# Patient Record
Sex: Female | Born: 1995 | Race: Black or African American | Hispanic: No | Marital: Single | Smoking: Never smoker
Health system: Southern US, Community
[De-identification: ages and names within clinical notes are randomized; demographics above are authoritative.]

## PROBLEM LIST (undated history)

## (undated) ENCOUNTER — Inpatient Hospital Stay (HOSPITAL_COMMUNITY): Payer: Self-pay

## (undated) DIAGNOSIS — Z9889 Other specified postprocedural states: Secondary | ICD-10-CM

## (undated) DIAGNOSIS — D649 Anemia, unspecified: Secondary | ICD-10-CM

## (undated) DIAGNOSIS — A599 Trichomoniasis, unspecified: Secondary | ICD-10-CM

## (undated) DIAGNOSIS — J45909 Unspecified asthma, uncomplicated: Secondary | ICD-10-CM

## (undated) DIAGNOSIS — A749 Chlamydial infection, unspecified: Secondary | ICD-10-CM

## (undated) DIAGNOSIS — F419 Anxiety disorder, unspecified: Secondary | ICD-10-CM

## (undated) DIAGNOSIS — K219 Gastro-esophageal reflux disease without esophagitis: Secondary | ICD-10-CM

## (undated) DIAGNOSIS — A549 Gonococcal infection, unspecified: Secondary | ICD-10-CM

## (undated) DIAGNOSIS — T4145XA Adverse effect of unspecified anesthetic, initial encounter: Secondary | ICD-10-CM

## (undated) DIAGNOSIS — N390 Urinary tract infection, site not specified: Secondary | ICD-10-CM

## (undated) DIAGNOSIS — T8859XA Other complications of anesthesia, initial encounter: Secondary | ICD-10-CM

## (undated) DIAGNOSIS — N809 Endometriosis, unspecified: Secondary | ICD-10-CM

## (undated) DIAGNOSIS — R112 Nausea with vomiting, unspecified: Secondary | ICD-10-CM

## (undated) HISTORY — PX: WISDOM TOOTH EXTRACTION: SHX21

## (undated) HISTORY — PX: TONSILLECTOMY: SUR1361

## (undated) HISTORY — PX: OTHER SURGICAL HISTORY: SHX169

## (undated) HISTORY — PX: TYMPANOSTOMY TUBE PLACEMENT: SHX32

---

## 2014-02-08 ENCOUNTER — Emergency Department (HOSPITAL_COMMUNITY)
Admission: EM | Admit: 2014-02-08 | Discharge: 2014-02-09 | Disposition: A | Discharge status: Home / Self Care | Payer: BC Managed Care – PPO | Attending: Emergency Medicine | Admitting: Emergency Medicine

## 2014-02-08 ENCOUNTER — Encounter (HOSPITAL_COMMUNITY): Payer: Self-pay | Admitting: Emergency Medicine

## 2014-02-08 DIAGNOSIS — Z792 Long term (current) use of antibiotics: Secondary | ICD-10-CM | POA: Diagnosis not present

## 2014-02-08 DIAGNOSIS — Z7951 Long term (current) use of inhaled steroids: Secondary | ICD-10-CM | POA: Insufficient documentation

## 2014-02-08 DIAGNOSIS — K0889 Other specified disorders of teeth and supporting structures: Secondary | ICD-10-CM

## 2014-02-08 DIAGNOSIS — R102 Pelvic and perineal pain: Secondary | ICD-10-CM | POA: Diagnosis not present

## 2014-02-08 DIAGNOSIS — R55 Syncope and collapse: Secondary | ICD-10-CM | POA: Diagnosis not present

## 2014-02-08 DIAGNOSIS — Z8619 Personal history of other infectious and parasitic diseases: Secondary | ICD-10-CM | POA: Insufficient documentation

## 2014-02-08 DIAGNOSIS — Z3202 Encounter for pregnancy test, result negative: Secondary | ICD-10-CM | POA: Diagnosis not present

## 2014-02-08 DIAGNOSIS — K120 Recurrent oral aphthae: Secondary | ICD-10-CM

## 2014-02-08 DIAGNOSIS — K08409 Partial loss of teeth, unspecified cause, unspecified class: Secondary | ICD-10-CM | POA: Insufficient documentation

## 2014-02-08 DIAGNOSIS — R112 Nausea with vomiting, unspecified: Secondary | ICD-10-CM | POA: Diagnosis present

## 2014-02-08 DIAGNOSIS — K088 Other specified disorders of teeth and supporting structures: Secondary | ICD-10-CM | POA: Insufficient documentation

## 2014-02-08 DIAGNOSIS — J45909 Unspecified asthma, uncomplicated: Secondary | ICD-10-CM | POA: Diagnosis not present

## 2014-02-08 HISTORY — DX: Gastro-esophageal reflux disease without esophagitis: K21.9

## 2014-02-08 HISTORY — DX: Unspecified asthma, uncomplicated: J45.909

## 2014-02-08 LAB — URINALYSIS, ROUTINE W REFLEX MICROSCOPIC
Glucose, UA: NEGATIVE mg/dL
HGB URINE DIPSTICK: NEGATIVE
Ketones, ur: >80 mg/dL — AB
NITRITE: NEGATIVE
PH: 6 (ref 5.0–8.0)
Protein, ur: NEGATIVE mg/dL
SPECIFIC GRAVITY, URINE: 1.028 (ref 1.005–1.030)
UROBILINOGEN UA: 1 mg/dL (ref 0.0–1.0)

## 2014-02-08 LAB — POC URINE PREG, ED: PREG TEST UR: NEGATIVE

## 2014-02-08 LAB — URINE MICROSCOPIC-ADD ON

## 2014-02-08 NOTE — ED Notes (Signed)
Pt presents with c/o n/v, strong odor in urine, infected wisdom teeth extraction site, ?thrush in mouth, pt states she had syncope x 2 today.

## 2014-02-09 LAB — CBC WITH DIFFERENTIAL/PLATELET
Basophils Absolute: 0.1 10*3/uL (ref 0.0–0.1)
Basophils Relative: 1 % (ref 0–1)
EOS ABS: 0.1 10*3/uL (ref 0.0–0.7)
Eosinophils Relative: 2 % (ref 0–5)
HCT: 36.7 % (ref 36.0–46.0)
Hemoglobin: 12.2 g/dL (ref 12.0–15.0)
LYMPHS ABS: 2.2 10*3/uL (ref 0.7–4.0)
Lymphocytes Relative: 38 % (ref 12–46)
MCH: 26.8 pg (ref 26.0–34.0)
MCHC: 33.2 g/dL (ref 30.0–36.0)
MCV: 80.5 fL (ref 78.0–100.0)
Monocytes Absolute: 0.5 10*3/uL (ref 0.1–1.0)
Monocytes Relative: 9 % (ref 3–12)
Neutro Abs: 2.9 10*3/uL (ref 1.7–7.7)
Neutrophils Relative %: 50 % (ref 43–77)
Platelets: 247 10*3/uL (ref 150–400)
RBC: 4.56 MIL/uL (ref 3.87–5.11)
RDW: 12.2 % (ref 11.5–15.5)
WBC: 5.8 10*3/uL (ref 4.0–10.5)

## 2014-02-09 LAB — COMPREHENSIVE METABOLIC PANEL
ALBUMIN: 3.9 g/dL (ref 3.5–5.2)
ALT: 12 U/L (ref 0–35)
ANION GAP: 17 — AB (ref 5–15)
AST: 15 U/L (ref 0–37)
Alkaline Phosphatase: 64 U/L (ref 39–117)
BILIRUBIN TOTAL: 0.9 mg/dL (ref 0.3–1.2)
BUN: 11 mg/dL (ref 6–23)
CALCIUM: 9.2 mg/dL (ref 8.4–10.5)
CO2: 23 mEq/L (ref 19–32)
CREATININE: 0.66 mg/dL (ref 0.50–1.10)
Chloride: 98 mEq/L (ref 96–112)
GFR calc Af Amer: >90 mL/min (ref >90)
GFR calc non Af Amer: >90 mL/min (ref >90)
Glucose, Bld: 71 mg/dL (ref 70–99)
Potassium: 3.8 mEq/L (ref 3.7–5.3)
Sodium: 138 mEq/L (ref 137–147)
Total Protein: 8 g/dL (ref 6.0–8.3)

## 2014-02-09 LAB — WET PREP, GENITAL
Clue Cells Wet Prep HPF POC: NONE SEEN
TRICH WET PREP: NONE SEEN
Yeast Wet Prep HPF POC: NONE SEEN

## 2014-02-09 MED ORDER — GI COCKTAIL ~~LOC~~
30.0000 mL | Freq: Once | ORAL | Status: AC
  Administered 2014-02-09: 30 mL via ORAL
  Filled 2014-02-09: qty 30

## 2014-02-09 MED ORDER — SODIUM CHLORIDE 0.9 % IV BOLUS (SEPSIS)
1000.0000 mL | INTRAVENOUS | Status: AC
  Administered 2014-02-09: 1000 mL via INTRAVENOUS

## 2014-02-09 MED ORDER — HYDROMORPHONE HCL 1 MG/ML IJ SOLN
1.0000 mg | Freq: Once | INTRAMUSCULAR | Status: AC
  Administered 2014-02-09: 1 mg via INTRAVENOUS
  Filled 2014-02-09: qty 1

## 2014-02-09 MED ORDER — ONDANSETRON HCL 4 MG PO TABS: 4.0000 mg | ORAL_TABLET | Freq: Three times a day (TID) | ORAL | Status: DC | PRN

## 2014-02-09 MED ORDER — OXYCODONE HCL 5 MG PO TABS: 5.0000 mg | ORAL_TABLET | ORAL | Status: DC | PRN

## 2014-02-09 MED ORDER — MAGIC MOUTHWASH: 5.0000 mL | Freq: Four times a day (QID) | ORAL | Status: DC | PRN

## 2014-02-09 NOTE — ED Notes (Signed)
Pt feels slightly nauseated and in pain 4/10,  Dr Romeo AppleHarrison aware

## 2014-02-09 NOTE — Discharge Instructions (Signed)
Abdominal Pain, Women °Abdominal (stomach, pelvic, or belly) pain can be caused by many things. It is important to tell your doctor: °· The location of the pain. °· Does it come and go or is it present all the time? °· Are there things that start the pain (eating certain foods, exercise)? °· Are there other symptoms associated with the pain (fever, nausea, vomiting, diarrhea)? °All of this is helpful to know when trying to find the cause of the pain. °CAUSES  °· Stomach: virus or bacteria infection, or ulcer. °· Intestine: appendicitis (inflamed appendix), regional ileitis (Crohn's disease), ulcerative colitis (inflamed colon), irritable bowel syndrome, diverticulitis (inflamed diverticulum of the colon), or cancer of the stomach or intestine. °· Gallbladder disease or stones in the gallbladder. °· Kidney disease, kidney stones, or infection. °· Pancreas infection or cancer. °· Fibromyalgia (pain disorder). °· Diseases of the female organs: °¨ Uterus: fibroid (non-cancerous) tumors or infection. °¨ Fallopian tubes: infection or tubal pregnancy. °¨ Ovary: cysts or tumors. °¨ Pelvic adhesions (scar tissue). °¨ Endometriosis (uterus lining tissue growing in the pelvis and on the pelvic organs). °¨ Pelvic congestion syndrome (female organs filling up with blood just before the menstrual period). °¨ Pain with the menstrual period. °¨ Pain with ovulation (producing an egg). °¨ Pain with an IUD (intrauterine device, birth control) in the uterus. °¨ Cancer of the female organs. °· Functional pain (pain not caused by a disease, may improve without treatment). °· Psychological pain. °· Depression. °DIAGNOSIS  °Your doctor will decide the seriousness of your pain by doing an examination. °· Blood tests. °· X-rays. °· Ultrasound. °· CT scan (computed tomography, special type of X-ray). °· MRI (magnetic resonance imaging). °· Cultures, for infection. °· Barium enema (dye inserted in the large intestine, to better view it with  X-rays). °· Colonoscopy (looking in intestine with a lighted tube). °· Laparoscopy (minor surgery, looking in abdomen with a lighted tube). °· Major abdominal exploratory surgery (looking in abdomen with a large incision). °TREATMENT  °The treatment will depend on the cause of the pain.  °· Many cases can be observed and treated at home. °· Over-the-counter medicines recommended by your caregiver. °· Prescription medicine. °· Antibiotics, for infection. °· Birth control pills, for painful periods or for ovulation pain. °· Hormone treatment, for endometriosis. °· Nerve blocking injections. °· Physical therapy. °· Antidepressants. °· Counseling with a psychologist or psychiatrist. °· Minor or major surgery. °HOME CARE INSTRUCTIONS  °· Do not take laxatives, unless directed by your caregiver. °· Take over-the-counter pain medicine only if ordered by your caregiver. Do not take aspirin because it can cause an upset stomach or bleeding. °· Try a clear liquid diet (broth or water) as ordered by your caregiver. Slowly move to a bland diet, as tolerated, if the pain is related to the stomach or intestine. °· Have a thermometer and take your temperature several times a day, and record it. °· Bed rest and sleep, if it helps the pain. °· Avoid sexual intercourse, if it causes pain. °· Avoid stressful situations. °· Keep your follow-up appointments and tests, as your caregiver orders. °· If the pain does not go away with medicine or surgery, you may try: °¨ Acupuncture. °¨ Relaxation exercises (yoga, meditation). °¨ Group therapy. °¨ Counseling. °SEEK MEDICAL CARE IF:  °· You notice certain foods cause stomach pain. °· Your home care treatment is not helping your pain. °· You need stronger pain medicine. °· You want your IUD removed. °· You feel faint or   lightheaded. °· You develop nausea and vomiting. °· You develop a rash. °· You are having side effects or an allergy to your medicine. °SEEK IMMEDIATE MEDICAL CARE IF:  °· Your  pain does not go away or gets worse. °· You have a fever. °· Your pain is felt only in portions of the abdomen. The right side could possibly be appendicitis. The left lower portion of the abdomen could be colitis or diverticulitis. °· You are passing blood in your stools (bright red or black tarry stools, with or without vomiting). °· You have blood in your urine. °· You develop chills, with or without a fever. °· You pass out. °MAKE SURE YOU:  °· Understand these instructions. °· Will watch your condition. °· Will get help right away if you are not doing well or get worse. °Document Released: 12/26/2006 Document Revised: 07/15/2013 Document Reviewed: 01/15/2009 °[employer]® Patient Information ©2015 [employer]. This information is not intended to replace advice given to you by your health care provider. Make sure you discuss any questions you have with your health care provider. ° °

## 2014-02-09 NOTE — ED Provider Notes (Signed)
CSN: [id number]     Arrival date & time 02/08/14  2131 History   First MD Initiated Contact with Patient 02/09/14 0037     Chief Complaint  Patient presents with  . Nausea     (Consider location/radiation/quality/duration/timing/severity/associated sxs/prior Treatment) Patient is a 18 y.o. female presenting with abdominal pain. The history is provided by the patient.  Abdominal Pain Pain location: pelvic. Pain quality: aching   Pain radiates to:  Does not radiate Pain severity:  Mild Onset quality:  Gradual Timing:  Intermittent Progression:  Unchanged Chronicity:  New Context comment:  Recently tx for STD Relieved by:  Nothing Worsened by:  Nothing tried Ineffective treatments:  None tried Associated symptoms: nausea and vomiting   Associated symptoms: no chest pain, no cough, no diarrhea, no dysuria, no fatigue, no fever, no hematuria and no shortness of breath     Past Medical History  Diagnosis Date  . Asthma   . GERD (gastroesophageal reflux disease)    Past Surgical History  Procedure Laterality Date  . Wisdom tooth extraction    . Tonsillectomy    . Post t/a bleed     No family history on file. History  Substance Use Topics  . Smoking status: Never Smoker   . Smokeless tobacco: Not on file  . Alcohol Use: Yes     Comment: occ   OB History    No data available     Review of Systems  Constitutional: Negative for fever and fatigue.  HENT: Positive for dental problem. Negative for congestion and drooling.   Eyes: Negative for pain.  Respiratory: Negative for cough and shortness of breath.   Cardiovascular: Negative for chest pain.       Syncope  Gastrointestinal: Positive for nausea and vomiting. Negative for abdominal pain and diarrhea.  Genitourinary: Positive for pelvic pain. Negative for dysuria and hematuria.  Musculoskeletal: Negative for back pain, gait problem and neck pain.  Skin: Negative for color change.  Neurological: Negative for  dizziness and headaches.  Hematological: Negative for adenopathy.  Psychiatric/Behavioral: Negative for behavioral problems.  All other systems reviewed and are negative.     Allergies  Review of patient's allergies indicates no known allergies.  Home Medications   Prior to Admission medications   Medication Sig Start Date End Date Taking? Authorizing Provider  Fluticasone Propionate, Inhal, (FLOVENT IN) Inhale 2 puffs into the lungs daily.   Yes Historical Provider, MD  oxyCODONE (OXY IR/ROXICODONE) 5 MG immediate release tablet Take 5 mg by mouth every 6 (six) hours as needed. 02/03/14  Yes Historical Provider, MD  amoxicillin (AMOXIL) 500 MG capsule Take 500 mg by mouth 3 (three) times daily. 02/03/14   Historical Provider, MD  azithromycin (ZITHROMAX) 500 MG tablet Take 500 mg by mouth once. 01/31/14   Historical Provider, MD   BP 126/71 mmHg  Pulse 86  Temp(Src) 98.2 F (36.8 C) (Oral)  Resp 16  Ht 5\' 5"  (1.651 m)  Wt 185 lb (83.915 kg)  BMI 30.79 kg/m2  SpO2 100%  LMP 01/08/2014 Physical Exam  Constitutional: She is oriented to person, place, and time. She appears well-developed and well-nourished.  HENT:  Head: Normocephalic.  Mouth/Throat: No oropharyngeal exudate.  Several aphthous ulcers noted on the roof of the mouth and on the posterior oropharynx.  Healing left lower wisdom tooth extraction site without evidence of infection. Dentition otherwise appears normal. No evidence of intraoral abscess.  Eyes: Conjunctivae and EOM are normal. Pupils are equal, round, and reactive  to light.  Neck: Normal range of motion. Neck supple.  Cardiovascular: Normal rate, regular rhythm, normal heart sounds and intact distal pulses.  Exam reveals no gallop and no friction rub.   No murmur heard. Pulmonary/Chest: Effort normal and breath sounds normal. No respiratory distress. She has no wheezes.  Abdominal: Soft. Bowel sounds are normal. There is no tenderness. There is no  rebound and no guarding.  Genitourinary:  Normal-appearing external vagina. Normal-appearing cervix. Os closed. Small amount of milky white fluid in the posterior fornix. No cervical motion tenderness. No adnexal tenderness.  Musculoskeletal: Normal range of motion. She exhibits no edema or tenderness.  Neurological: She is alert and oriented to person, place, and time.  Skin: Skin is warm and dry.  Psychiatric: She has a normal mood and affect. Her behavior is normal.  Nursing note and vitals reviewed.   ED Course  Procedures (including critical care time) Labs Review Labs Reviewed  WET PREP, GENITAL - Abnormal; Notable for the following:    WBC, Wet Prep HPF POC FEW (*)    All other components within normal limits  URINALYSIS, ROUTINE W REFLEX MICROSCOPIC - Abnormal; Notable for the following:    Bilirubin Urine SMALL (*)    Ketones, ur >80 (*)    Leukocytes, UA SMALL (*)    All other components within normal limits  URINE MICROSCOPIC-ADD ON - Abnormal; Notable for the following:    Bacteria, UA FEW (*)    All other components within normal limits  COMPREHENSIVE METABOLIC PANEL - Abnormal; Notable for the following:    Anion gap 17 (*)    All other components within normal limits  GC/CHLAMYDIA PROBE AMP  CBC WITH DIFFERENTIAL  POC URINE PREG, ED    Imaging Review No results found.   EKG Interpretation   Date/Time:  Sunday February 09 2014 01:28:40 EST Ventricular Rate:  79 PR Interval:  133 QRS Duration: 83 QT Interval:  394 QTC Calculation: 452 R Axis:   27 Text Interpretation:  Sinus rhythm isolated t wave inversion in III is  non-specific Confirmed by Tayanna Talford  MD, Anchor Dwan ([id number]) on 02/09/2014  1:40:53 AM      MDM   Final diagnoses:  Pain, dental  Aphthous ulcer  Pelvic pain in female    1:01 AM 18 y.o. female who presents with multiple complaints. She states that she had her left lower wisdom tooth extracted 5 days ago and is having ongoing tooth  pain. She has been unable to tolerate her amoxicillin due to vomiting. She denies any fevers. She has had ongoing intermittent pelvic pain. She states that she was recently treated for gonorrhea. She does not currently have any pelvic pain on exam. She also notes that she had a syncopal episode yesterday and today after getting up from sleep. She states that she felt lightheaded and blacked out. She is afebrile and vital signs are unremarkable here. She also complains of some lesions on the roof of her mouth and in the posterior oropharynx. They appear to be aphthous ulcers. Possibly a stress response due to her recent dental surgery. We'll get screening labs, perform pelvic, pain control, and IV fluids.   I interpreted/reviewed the labs and/or imaging which were non-contributory. Syncope likely orthostatic and related to dehydration. Pt continues to appear well. Will recommend she continue taking her amoxicillin. I have discussed the diagnosis/risks/treatment options with the patient and believe the pt to be eligible for discharge home to follow-up with obgyn as needed and her  dentist. We also discussed returning to the ED immediately if new or worsening sx occur. We discussed the sx which are most concerning (e.g., worsening pain, fever) that necessitate immediate return. Medications administered to the patient during their visit and any new prescriptions provided to the patient are listed below.  Medications given during this visit Medications  sodium chloride 0.9 % bolus 1,000 mL (0 mLs Intravenous Stopped 02/09/14 0309)  HYDROmorphone (DILAUDID) injection 1 mg (1 mg Intravenous Given 02/09/14 0112)  gi cocktail (Maalox,Lidocaine,Donnatal) (30 mLs Oral Given 02/09/14 0111)  HYDROmorphone (DILAUDID) injection 1 mg (1 mg Intravenous Given 02/09/14 0316)    Discharge Medication List as of 02/09/2014  3:12 AM    START taking these medications   Details  Alum & Mag Hydroxide-Simeth (MAGIC MOUTHWASH)  SOLN Take 5 mLs by mouth 4 (four) times daily as needed for mouth pain., Starting 02/09/2014, Until Discontinued, Print    ondansetron (ZOFRAN) 4 MG tablet Take 1 tablet (4 mg total) by mouth every 8 (eight) hours as needed for nausea or vomiting., Starting 02/09/2014, Until Discontinued, Print    !! oxyCODONE (OXY IR/ROXICODONE) 5 MG immediate release tablet Take 1 tablet (5 mg total) by mouth every 4 (four) hours as needed for moderate pain or severe pain., Starting 02/09/2014, Until Discontinued, Print     !! - Potential duplicate medications found. Please discuss with provider.        Purvis Sheffield, MD 02/09/14 [phone]

## 2014-02-11 LAB — GC/CHLAMYDIA PROBE AMP
CT PROBE, AMP APTIMA: NEGATIVE
GC PROBE AMP APTIMA: NEGATIVE

## 2017-01-12 ENCOUNTER — Encounter (HOSPITAL_COMMUNITY): Payer: Self-pay | Admitting: Emergency Medicine

## 2017-01-12 ENCOUNTER — Emergency Department (HOSPITAL_COMMUNITY)
Admission: EM | Admit: 2017-01-12 | Discharge: 2017-01-12 | Disposition: A | Discharge status: Home / Self Care | Payer: 59 | Attending: Emergency Medicine | Admitting: Emergency Medicine

## 2017-01-12 ENCOUNTER — Emergency Department (HOSPITAL_COMMUNITY): Payer: 59

## 2017-01-12 DIAGNOSIS — Z23 Encounter for immunization: Secondary | ICD-10-CM | POA: Insufficient documentation

## 2017-01-12 DIAGNOSIS — Y92511 Restaurant or cafe as the place of occurrence of the external cause: Secondary | ICD-10-CM | POA: Diagnosis not present

## 2017-01-12 DIAGNOSIS — R51 Headache: Secondary | ICD-10-CM | POA: Diagnosis not present

## 2017-01-12 DIAGNOSIS — S21011A Laceration without foreign body of right breast, initial encounter: Secondary | ICD-10-CM

## 2017-01-12 DIAGNOSIS — S0181XA Laceration without foreign body of other part of head, initial encounter: Secondary | ICD-10-CM | POA: Insufficient documentation

## 2017-01-12 DIAGNOSIS — Y999 Unspecified external cause status: Secondary | ICD-10-CM | POA: Insufficient documentation

## 2017-01-12 DIAGNOSIS — S2001XA Contusion of right breast, initial encounter: Secondary | ICD-10-CM

## 2017-01-12 DIAGNOSIS — Y939 Activity, unspecified: Secondary | ICD-10-CM | POA: Insufficient documentation

## 2017-01-12 DIAGNOSIS — J45909 Unspecified asthma, uncomplicated: Secondary | ICD-10-CM | POA: Insufficient documentation

## 2017-01-12 DIAGNOSIS — S0990XA Unspecified injury of head, initial encounter: Secondary | ICD-10-CM

## 2017-01-12 LAB — BPAM FFP
Blood Product Expiration Date: 201811022359
Blood Product Expiration Date: 201811042359
ISSUE DATE / TIME: 201811010402
ISSUE DATE / TIME: 201811010402
UNIT TYPE AND RH: 600
UNIT TYPE AND RH: 6200

## 2017-01-12 LAB — URINALYSIS, ROUTINE W REFLEX MICROSCOPIC
Bacteria, UA: NONE SEEN
Bilirubin Urine: NEGATIVE
Glucose, UA: NEGATIVE mg/dL
Hgb urine dipstick: NEGATIVE
Ketones, ur: 5 mg/dL — AB
Leukocytes, UA: NEGATIVE
NITRITE: NEGATIVE
Protein, ur: 100 mg/dL — AB
SPECIFIC GRAVITY, URINE: 1.041 — AB (ref 1.005–1.030)
Squamous Epithelial / LPF: NONE SEEN
pH: 5 (ref 5.0–8.0)

## 2017-01-12 LAB — COMPREHENSIVE METABOLIC PANEL
ALBUMIN: 4.2 g/dL (ref 3.5–5.0)
ALK PHOS: 69 U/L (ref 38–126)
ALT: 30 U/L (ref 14–54)
ANION GAP: 11 (ref 5–15)
AST: 31 U/L (ref 15–41)
BILIRUBIN TOTAL: 0.8 mg/dL (ref 0.3–1.2)
BUN: 11 mg/dL (ref 6–20)
CALCIUM: 9.1 mg/dL (ref 8.9–10.3)
CO2: 18 mmol/L — ABNORMAL LOW (ref 22–32)
Chloride: 111 mmol/L (ref 101–111)
Creatinine, Ser: 0.97 mg/dL (ref 0.44–1.00)
GFR calc Af Amer: >60 mL/min (ref >60)
GLUCOSE: 125 mg/dL — AB (ref 65–99)
Potassium: 3.2 mmol/L — ABNORMAL LOW (ref 3.5–5.1)
Sodium: 140 mmol/L (ref 135–145)
TOTAL PROTEIN: 7.6 g/dL (ref 6.5–8.1)

## 2017-01-12 LAB — CBC
HCT: 37.6 % (ref 36.0–46.0)
Hemoglobin: 12.4 g/dL (ref 12.0–15.0)
MCH: 27 pg (ref 26.0–34.0)
MCHC: 33 g/dL (ref 30.0–36.0)
MCV: 81.9 fL (ref 78.0–100.0)
Platelets: 213 10*3/uL (ref 150–400)
RBC: 4.59 MIL/uL (ref 3.87–5.11)
RDW: 13.3 % (ref 11.5–15.5)
WBC: 7.1 10*3/uL (ref 4.0–10.5)

## 2017-01-12 LAB — BPAM RBC
BLOOD PRODUCT EXPIRATION DATE: 201811142359
Blood Product Expiration Date: 201811142359
ISSUE DATE / TIME: 201811010402
ISSUE DATE / TIME: 201811010402
UNIT TYPE AND RH: 9500
Unit Type and Rh: 9500

## 2017-01-12 LAB — ABO/RH: ABO/RH(D): O POS

## 2017-01-12 LAB — I-STAT CHEM 8, ED
BUN: 12 mg/dL (ref 6–20)
CALCIUM ION: 1.04 mmol/L — AB (ref 1.15–1.40)
CHLORIDE: 110 mmol/L (ref 101–111)
CREATININE: 0.8 mg/dL (ref 0.44–1.00)
Glucose, Bld: 129 mg/dL — ABNORMAL HIGH (ref 65–99)
HCT: 38 % (ref 36.0–46.0)
Hemoglobin: 12.9 g/dL (ref 12.0–15.0)
Potassium: 3.2 mmol/L — ABNORMAL LOW (ref 3.5–5.1)
SODIUM: 142 mmol/L (ref 135–145)
TCO2: 19 mmol/L — AB (ref 22–32)

## 2017-01-12 LAB — PREPARE FRESH FROZEN PLASMA
UNIT DIVISION: 0
Unit division: 0

## 2017-01-12 LAB — RAPID URINE DRUG SCREEN, HOSP PERFORMED
AMPHETAMINES: POSITIVE — AB
Barbiturates: NOT DETECTED
Benzodiazepines: NOT DETECTED
Cocaine: NOT DETECTED
Opiates: NOT DETECTED
Tetrahydrocannabinol: POSITIVE — AB

## 2017-01-12 LAB — TYPE AND SCREEN
ABO/RH(D): O POS
ANTIBODY SCREEN: NEGATIVE
UNIT DIVISION: 0
Unit division: 0

## 2017-01-12 LAB — PROTIME-INR
INR: 1.09
PROTHROMBIN TIME: 14 s (ref 11.4–15.2)

## 2017-01-12 LAB — ETHANOL

## 2017-01-12 LAB — I-STAT BETA HCG BLOOD, ED (MC, WL, AP ONLY)

## 2017-01-12 MED ORDER — IOPAMIDOL (ISOVUE-300) INJECTION 61%
INTRAVENOUS | Status: AC
  Administered 2017-01-12: 75 mL
  Filled 2017-01-12: qty 75

## 2017-01-12 MED ORDER — SODIUM CHLORIDE 0.9 % IV SOLN
INTRAVENOUS | Status: DC
  Administered 2017-01-12: 05:00:00 via INTRAVENOUS

## 2017-01-12 MED ORDER — FENTANYL CITRATE (PF) 100 MCG/2ML IJ SOLN
50.0000 ug | Freq: Once | INTRAMUSCULAR | Status: AC
Start: 2017-01-12 — End: 2017-01-12
  Administered 2017-01-12: 50 ug via INTRAVENOUS

## 2017-01-12 MED ORDER — TRAMADOL HCL 50 MG PO TABS: 50.0000 mg | ORAL_TABLET | Freq: Four times a day (QID) | ORAL | 0 refills | Status: DC | PRN

## 2017-01-12 MED ORDER — FENTANYL CITRATE (PF) 100 MCG/2ML IJ SOLN
50.0000 ug | Freq: Once | INTRAMUSCULAR | Status: AC
  Administered 2017-01-12: 50 ug via INTRAVENOUS
  Filled 2017-01-12: qty 2

## 2017-01-12 MED ORDER — ONDANSETRON HCL 4 MG/2ML IJ SOLN
4.0000 mg | Freq: Once | INTRAMUSCULAR | Status: AC
  Administered 2017-01-12: 4 mg via INTRAVENOUS
  Filled 2017-01-12: qty 2

## 2017-01-12 MED ORDER — TETANUS-DIPHTH-ACELL PERTUSSIS 5-2.5-18.5 LF-MCG/0.5 IM SUSP
0.5000 mL | Freq: Once | INTRAMUSCULAR | Status: AC
  Administered 2017-01-12: 0.5 mL via INTRAMUSCULAR
  Filled 2017-01-12: qty 0.5

## 2017-01-12 MED ORDER — SODIUM CHLORIDE 0.9 % IV BOLUS (SEPSIS)
1000.0000 mL | Freq: Once | INTRAVENOUS | Status: AC
  Administered 2017-01-12: 1000 mL via INTRAVENOUS

## 2017-01-12 MED ORDER — LIDOCAINE-EPINEPHRINE (PF) 2 %-1:200000 IJ SOLN
10.0000 mL | Freq: Once | INTRAMUSCULAR | Status: AC
  Administered 2017-01-12: 10 mL via INTRADERMAL
  Filled 2017-01-12: qty 20

## 2017-01-12 MED ORDER — IBUPROFEN 800 MG PO TABS: 800.0000 mg | ORAL_TABLET | Freq: Three times a day (TID) | ORAL | 0 refills | Status: DC | PRN

## 2017-01-12 MED ORDER — FENTANYL CITRATE (PF) 100 MCG/2ML IJ SOLN
INTRAMUSCULAR | Status: AC
  Filled 2017-01-12: qty 2

## 2017-01-12 NOTE — Discharge Instructions (Signed)
You may alternate Tylenol 1000 mg every 6 hours as needed for pain and Ibuprofen 800 mg every 8 hours as needed for pain.  Please take Ibuprofen with food. ° °

## 2017-01-12 NOTE — ED Provider Notes (Signed)
  LACERATION REPAIR Performed by: Garlon HatchetSANDERS, Sabree Nuon M Authorized by: Garlon HatchetSANDERS, Arvis Miguez M Consent: Verbal consent obtained. Risks and benefits: risks, benefits and alternatives were discussed Consent given by: patient Patient identity confirmed: provided demographic data Prepped and Draped in normal sterile fashion Wound explored  Laceration Location: right breast  Laceration Length: 5cm  No Foreign Bodies seen or palpated  Anesthesia: local infiltration  Local anesthetic: lidocaine 2% with epinephrine  Anesthetic total: 5 ml  Irrigation method: syringe Amount of cleaning: standard  Skin closure: 4-0 prolene  Number of sutures: 6  Technique: simple interrupted  Patient tolerance: Patient tolerated the procedure well with no immediate complications.    Garlon HatchetSanders, Sherard Sutch M, PA-C 01/12/17 [phone]
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
## 2017-01-12 NOTE — ED Notes (Signed)
Patient transported to CT scan . 
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
## 2017-06-05 ENCOUNTER — Other Ambulatory Visit: Payer: Self-pay

## 2017-06-05 ENCOUNTER — Ambulatory Visit (HOSPITAL_COMMUNITY)
Admission: EM | Admit: 2017-06-05 | Discharge: 2017-06-05 | Disposition: A | Discharge status: Home / Self Care | Payer: 59 | Attending: Family Medicine | Admitting: Family Medicine

## 2017-06-05 ENCOUNTER — Encounter (HOSPITAL_COMMUNITY): Payer: Self-pay | Admitting: Emergency Medicine

## 2017-06-05 DIAGNOSIS — Z3201 Encounter for pregnancy test, result positive: Secondary | ICD-10-CM | POA: Diagnosis not present

## 2017-06-05 DIAGNOSIS — R102 Pelvic and perineal pain: Secondary | ICD-10-CM | POA: Diagnosis not present

## 2017-06-05 DIAGNOSIS — Z3A01 Less than 8 weeks gestation of pregnancy: Secondary | ICD-10-CM

## 2017-06-05 DIAGNOSIS — R252 Cramp and spasm: Secondary | ICD-10-CM | POA: Diagnosis not present

## 2017-06-05 HISTORY — DX: Endometriosis, unspecified: N80.9

## 2017-06-05 LAB — POCT URINALYSIS DIP (DEVICE)
BILIRUBIN URINE: NEGATIVE
Glucose, UA: NEGATIVE mg/dL
HGB URINE DIPSTICK: NEGATIVE
Ketones, ur: NEGATIVE mg/dL
Leukocytes, UA: NEGATIVE
Nitrite: NEGATIVE
PROTEIN: 100 mg/dL — AB
SPECIFIC GRAVITY, URINE: 1.025 (ref 1.005–1.030)
Urobilinogen, UA: 0.2 mg/dL (ref 0.0–1.0)
pH: 7 (ref 5.0–8.0)

## 2017-06-05 LAB — POCT PREGNANCY, URINE: Preg Test, Ur: POSITIVE — AB

## 2017-06-05 MED ORDER — PRENATAL COMPLETE 14-0.4 MG PO TABS: 1.0000 | ORAL_TABLET | Freq: Every day | ORAL | 0 refills | Status: AC

## 2017-06-05 NOTE — ED Triage Notes (Signed)
C/o pelvic pain onset 2 days, states has endometriosis, LMP: 05/04/2017. Denies dysuria or vaginal changes. States is cramping because "supposed to start period"

## 2017-06-05 NOTE — Discharge Instructions (Signed)
Prenatal vitamin daily. Increase your water intake. See provided list of medications safe for pregnancy, as needed. Tylenol as needed for pain. Please follow up with OB for first appointment. If develop increased pain, fevers, bleeding please go to Er.

## 2017-06-05 NOTE — ED Provider Notes (Signed)
[facility]    CSN: [id number] Arrival date & time: 06/05/17  1934     History   Chief Complaint Chief Complaint  Patient presents with  . Abdominal Pain    HPI Brittany Leblanc is a 22 y.o. female.   Brittany Leblanc presents with complaints of low pelvic pressure and cramping which started 3 days ago. States she tends to have some low abdominal pain, history of endometriosis confirmed with laparoscopy, but this feels different. Denies nausea, vomiting, diarrhea. Denies urinary symptoms. Denies vaginal bleeding discharge itching or pain. Denies back pain. LMP 2/21 but states has had late periods in the past. She is sexually active with 1 partner, does not use condoms and is not on birth control. Has had a previous abortion, no other previous pregnancies. No known ill contacts. Has not taken any medications for symptoms. Hx of asthma, gerd.     ROS per HPI.      Past Medical History:  Diagnosis Date  . Asthma   . Endometriosis   . GERD (gastroesophageal reflux disease)     There are no active problems to display for this patient.   Past Surgical History:  Procedure Laterality Date  . Post t/a bleed    . TONSILLECTOMY    . WISDOM TOOTH EXTRACTION      OB History   None      Home Medications    Prior to Admission medications   Medication Sig Start Date End Date Taking? Authorizing Provider  albuterol (PROVENTIL HFA;VENTOLIN HFA) 108 (90 Base) MCG/ACT inhaler Inhale 1-2 puffs into the lungs every 6 (six) hours as needed for wheezing or shortness of breath.    [provider]  Alum & Mag Hydroxide-Simeth (MAGIC MOUTHWASH) SOLN Take 5 mLs by mouth 4 (four) times daily as needed for mouth pain. 02/09/14   Purvis SheffieldHarrison, Forrest, MD  azithromycin (ZITHROMAX) 500 MG tablet Take 500 mg by mouth once. 01/31/14   [provider]  Fluticasone Propionate, Inhal, (FLOVENT IN) Inhale 2 puffs into the lungs daily.    [provider]  ibuprofen  (ADVIL,MOTRIN) 800 MG tablet Take 1 tablet (800 mg total) by mouth every 8 (eight) hours as needed for mild pain. 01/12/17   Ward, Layla MawKristen N, DO  ondansetron (ZOFRAN) 4 MG tablet Take 1 tablet (4 mg total) by mouth every 8 (eight) hours as needed for nausea or vomiting. 02/09/14   Purvis SheffieldHarrison, Forrest, MD  oxyCODONE (OXY IR/ROXICODONE) 5 MG immediate release tablet Take 5 mg by mouth every 6 (six) hours as needed. 02/03/14   [provider]  oxyCODONE (OXY IR/ROXICODONE) 5 MG immediate release tablet Take 1 tablet (5 mg total) by mouth every 4 (four) hours as needed for moderate pain or severe pain. 02/09/14   Purvis SheffieldHarrison, Forrest, MD  Prenatal Vit-Fe Fumarate-FA (PRENATAL COMPLETE) 14-0.4 MG TABS Take 1 tablet by mouth daily. 06/05/17   Georgetta HaberBurky, Natalie B, NP  traMADol (ULTRAM) 50 MG tablet Take 1 tablet (50 mg total) by mouth every 6 (six) hours as needed. 01/12/17   Ward, Layla MawKristen N, DO    Family History No family history on file.  Social History Social History   Tobacco Use  . Smoking status: Never Smoker  . Smokeless tobacco: Never Used  Substance Use Topics  . Alcohol use: Yes    Comment: occ  . Drug use: No     Allergies   Patient has no known allergies.   Review of Systems Review of Systems   Physical  Exam Triage Vital Signs ED Triage Vitals  Enc Vitals Group     BP 06/05/17 2037 (!) 115/59     Pulse Rate 06/05/17 2037 95     Resp --      Temp 06/05/17 2037 98.9 F (37.2 C)     Temp Source 06/05/17 2037 Oral     SpO2 06/05/17 2037 100 %     Weight --      Height --      Head Circumference --      Peak Flow --      Pain Score 06/05/17 2035 4     Pain Loc --      Pain Edu? --      Excl. in GC? --    No data found.  Updated Vital Signs BP (!) 115/59 (BP Location: Left Arm)   Pulse 95   Temp 98.9 F (37.2 C) (Oral)   SpO2 100%   Visual Acuity Right Eye Distance:   Left Eye Distance:   Bilateral Distance:    Right Eye Near:   Left Eye Near:      Bilateral Near:     Physical Exam  Constitutional: She is oriented to person, place, and time. She appears well-developed and well-nourished. No distress.  Cardiovascular: Normal rate, regular rhythm and normal heart sounds.  Pulmonary/Chest: Effort normal and breath sounds normal.  Abdominal: There is tenderness in the suprapubic area.  Very mild suprapubic tenderness without RLQ or LLQ abdominal pain   Genitourinary:  Genitourinary Comments: Denies bleeding or vaginal symptoms, gu exam deferred at this time  Neurological: She is alert and oriented to person, place, and time.  Skin: Skin is warm and dry.     UC Treatments / Results  Labs (all labs ordered are listed, but only abnormal results are displayed) Labs Reviewed  POCT URINALYSIS DIP (DEVICE) - Abnormal; Notable for the following components:      Result Value   Protein, ur 100 (*)    All other components within normal limits  POCT PREGNANCY, URINE - Abnormal; Notable for the following components:   Preg Test, Ur POSITIVE (*)    All other components within normal limits    EKG None Radiology No results found.  Procedures Procedures (including critical care time)  Medications Ordered in UC Medications - No data to display   Initial Impression / Assessment and Plan / UC Course  I have reviewed the triage vital signs and the nursing notes.  Pertinent labs & imaging results that were available during my care of the patient were reviewed by me and considered in my medical decision making (see chart for details).     Positive pregnancy today in clinic. Patient non toxic in appearance with cramping type pain. Likely related to pregnancy at this time. Without bleeding, fevers, n/v/d, urinary symptoms. Prenatal vitamins recommended. Tylenol as needed. Encouraged follow up with ob/gyn. Return precautions provided. Safe medication list provided. Patient verbalized understanding and agreeable to plan.    Final Clinical  Impressions(s) / UC Diagnoses   Final diagnoses:  Pelvic cramping  Less than [redacted] weeks gestation of pregnancy    ED Discharge Orders        Ordered    Prenatal Vit-Fe Fumarate-FA (PRENATAL COMPLETE) 14-0.4 MG TABS  Daily     06/05/17 2109       Controlled Substance Prescriptions [place] Controlled Substance Registry consulted? Not Applicable   Georgetta Haber, NP 06/05/17 2134

## 2017-06-19 ENCOUNTER — Inpatient Hospital Stay (HOSPITAL_COMMUNITY)
Admission: AD | Admit: 2017-06-19 | Discharge: 2017-06-19 | Disposition: A | Discharge status: Home / Self Care | Payer: 59 | Source: Ambulatory Visit | Attending: Obstetrics & Gynecology | Admitting: Obstetrics & Gynecology

## 2017-06-19 ENCOUNTER — Encounter (HOSPITAL_COMMUNITY): Payer: Self-pay | Admitting: *Deleted

## 2017-06-19 ENCOUNTER — Inpatient Hospital Stay (HOSPITAL_COMMUNITY): Payer: 59

## 2017-06-19 ENCOUNTER — Other Ambulatory Visit: Payer: Self-pay

## 2017-06-19 DIAGNOSIS — O26891 Other specified pregnancy related conditions, first trimester: Secondary | ICD-10-CM | POA: Diagnosis not present

## 2017-06-19 DIAGNOSIS — O26899 Other specified pregnancy related conditions, unspecified trimester: Secondary | ICD-10-CM

## 2017-06-19 DIAGNOSIS — R109 Unspecified abdominal pain: Secondary | ICD-10-CM | POA: Diagnosis present

## 2017-06-19 DIAGNOSIS — Z3A01 Less than 8 weeks gestation of pregnancy: Secondary | ICD-10-CM | POA: Insufficient documentation

## 2017-06-19 DIAGNOSIS — R102 Pelvic and perineal pain: Secondary | ICD-10-CM | POA: Diagnosis not present

## 2017-06-19 HISTORY — DX: Chlamydial infection, unspecified: A74.9

## 2017-06-19 HISTORY — DX: Other specified postprocedural states: R11.2

## 2017-06-19 HISTORY — DX: Gonococcal infection, unspecified: A54.9

## 2017-06-19 HISTORY — DX: Anxiety disorder, unspecified: F41.9

## 2017-06-19 HISTORY — DX: Anemia, unspecified: D64.9

## 2017-06-19 HISTORY — DX: Adverse effect of unspecified anesthetic, initial encounter: T41.45XA

## 2017-06-19 HISTORY — DX: Trichomoniasis, unspecified: A59.9

## 2017-06-19 HISTORY — DX: Urinary tract infection, site not specified: N39.0

## 2017-06-19 HISTORY — DX: Other complications of anesthesia, initial encounter: T88.59XA

## 2017-06-19 HISTORY — DX: Nausea with vomiting, unspecified: Z98.890

## 2017-06-19 LAB — WET PREP, GENITAL
CLUE CELLS WET PREP: NONE SEEN
SPERM: NONE SEEN
Trich, Wet Prep: NONE SEEN
YEAST WET PREP: NONE SEEN

## 2017-06-19 LAB — CBC
HCT: 32.7 % — ABNORMAL LOW (ref 36.0–46.0)
Hemoglobin: 11.1 g/dL — ABNORMAL LOW (ref 12.0–15.0)
MCH: 28.3 pg (ref 26.0–34.0)
MCHC: 33.9 g/dL (ref 30.0–36.0)
MCV: 83.4 fL (ref 78.0–100.0)
PLATELETS: 190 10*3/uL (ref 150–400)
RBC: 3.92 MIL/uL (ref 3.87–5.11)
RDW: 13.2 % (ref 11.5–15.5)
WBC: 6.2 10*3/uL (ref 4.0–10.5)

## 2017-06-19 LAB — HCG, QUANTITATIVE, PREGNANCY: hCG, Beta Chain, Quant, S: 21165 m[IU]/mL — ABNORMAL HIGH (ref <5)

## 2017-06-19 NOTE — MAU Provider Note (Addendum)
Chief Complaint: Abdominal Pain   First Provider Initiated Contact with Patient 06/19/17 1816        SUBJECTIVE HPI: Brittany Leblanc is a 22 y.o. G1P0 at Unknown by LMP who presents to maternity admissions reporting pelvic cramping  Which worsened today. Spotted two days ago.   Had a visit in [place] with one HCG level which was 4000+ a week ago.. She denies vaginal itching/burning, urinary symptoms, h/a, dizziness, n/v, or fever/chills.    Abdominal Pain  This is a recurrent problem. The current episode started 1 to 4 weeks ago. The onset quality is gradual. The problem occurs intermittently. The problem has been unchanged. The pain is located in the suprapubic region. The pain is mild. The quality of the pain is cramping. The abdominal pain does not radiate. Pertinent negatives include no constipation, diarrhea, dysuria, fever, frequency, headaches or myalgias. Nothing aggravates the pain. The pain is relieved by nothing. She has tried nothing for the symptoms.    RN Note: Has been cramping ever since she has been pregnant.  Did some lifting today, now is having pain in lower abd. No bleeding.  Had some spotting a couple days ago. Has been OB, confirmed  preg and did an HCG level (? Last Tues-did not go back for f/u).   Past Medical History:  Diagnosis Date  . Asthma   . Endometriosis   . GERD (gastroesophageal reflux disease)    Past Surgical History:  Procedure Laterality Date  . Post t/a bleed    . TONSILLECTOMY    . WISDOM TOOTH EXTRACTION     Social History   Socioeconomic History  . Marital status: Single    Spouse name: Not on file  . Number of children: Not on file  . Years of education: Not on file  . Highest education level: Not on file  Occupational History  . Not on file  Social Needs  . Financial resource strain: Not on file  . Food insecurity:    Worry: Not on file    Inability: Not on file  . Transportation needs:    Medical: Not on file   Non-medical: Not on file  Tobacco Use  . Smoking status: Never Smoker  . Smokeless tobacco: Never Used  Substance and Sexual Activity  . Alcohol use: Yes    Comment: occ  . Drug use: No  . Sexual activity: Not on file  Lifestyle  . Physical activity:    Days per week: Not on file    Minutes per session: Not on file  . Stress: Not on file  Relationships  . Social connections:    Talks on phone: Not on file    Gets together: Not on file    Attends religious service: Not on file    Active member of club or organization: Not on file    Attends meetings of clubs or organizations: Not on file    Relationship status: Not on file  . Intimate partner violence:    Fear of current or ex partner: Not on file    Emotionally abused: Not on file    Physically abused: Not on file    Forced sexual activity: Not on file  Other Topics Concern  . Not on file  Social History Narrative   ** Merged History Encounter **       No current facility-administered medications on file prior to encounter.    Current Outpatient Medications on File Prior to Encounter  Medication Sig Dispense  Refill  . albuterol (PROVENTIL HFA;VENTOLIN HFA) 108 (90 Base) MCG/ACT inhaler Inhale 1-2 puffs into the lungs every 6 (six) hours as needed for wheezing or shortness of breath.    . Alum & Mag Hydroxide-Simeth (MAGIC MOUTHWASH) SOLN Take 5 mLs by mouth 4 (four) times daily as needed for mouth pain. 15 mL 0  . azithromycin (ZITHROMAX) 500 MG tablet Take 500 mg by mouth once.    . Fluticasone Propionate, Inhal, (FLOVENT IN) Inhale 2 puffs into the lungs daily.    Marland Kitchen ibuprofen (ADVIL,MOTRIN) 800 MG tablet Take 1 tablet (800 mg total) by mouth every 8 (eight) hours as needed for mild pain. 30 tablet 0  . ondansetron (ZOFRAN) 4 MG tablet Take 1 tablet (4 mg total) by mouth every 8 (eight) hours as needed for nausea or vomiting. 20 tablet 0  . oxyCODONE (OXY IR/ROXICODONE) 5 MG immediate release tablet Take 5 mg by mouth  every 6 (six) hours as needed.    Marland Kitchen oxyCODONE (OXY IR/ROXICODONE) 5 MG immediate release tablet Take 1 tablet (5 mg total) by mouth every 4 (four) hours as needed for moderate pain or severe pain. 20 tablet 0  . Prenatal Vit-Fe Fumarate-FA (PRENATAL COMPLETE) 14-0.4 MG TABS Take 1 tablet by mouth daily. 60 each 0  . traMADol (ULTRAM) 50 MG tablet Take 1 tablet (50 mg total) by mouth every 6 (six) hours as needed. 12 tablet 0   No Known Allergies  I have reviewed patient's Past Medical Hx, Surgical Hx, Family Hx, Social Hx, medications and allergies.   ROS:  Review of Systems  Constitutional: Negative for fever.  Gastrointestinal: Positive for abdominal pain. Negative for constipation and diarrhea.  Genitourinary: Negative for dysuria and frequency.  Musculoskeletal: Negative for myalgias.  Neurological: Negative for headaches.   Review of Systems  Other systems negative   Physical Exam  Physical Exam Patient Vitals for the past 24 hrs:  BP Temp Temp src Pulse Resp SpO2 Height Weight  06/19/17 1808 118/63 99.4 F (37.4 C) Oral 68 18 99 % 5\' 5"  (1.651 m) 188 lb 8 oz (85.5 kg)   Constitutional: Well-developed, well-nourished female in no acute distress.  Cardiovascular: normal rate Respiratory: normal effort GI: Abd soft, non-tender. Pos BS x 4 MS: Extremities nontender, no edema, normal ROM Neurologic: Alert and oriented x 4.  GU: Neg CVAT.  PELVIC EXAM: Cervix pink, visually closed, without lesion, scant white creamy discharge, vaginal walls and external genitalia normal Bimanual exam: Cervix 0/long/high, firm, anterior, neg CMT, uterus nontender, slightly enlarged, adnexa without tenderness, enlargement, or mass   LAB RESULTS Results for orders placed or performed during the hospital encounter of 06/19/17 (from the past 24 hour(s))  CBC     Status: Abnormal   Collection Time: 06/19/17  7:06 PM  Result Value Ref Range   WBC 6.2 4.0 - 10.5 K/uL   RBC 3.92 3.87 - 5.11  MIL/uL   Hemoglobin 11.1 (L) 12.0 - 15.0 g/dL   HCT 16.1 (L) 09.6 - 04.5 %   MCV 83.4 78.0 - 100.0 fL   MCH 28.3 26.0 - 34.0 pg   MCHC 33.9 30.0 - 36.0 g/dL   RDW 40.9 81.1 - 91.4 %   Platelets 190 150 - 400 K/uL  Wet prep, genital     Status: Abnormal   Collection Time: 06/19/17  7:15 PM  Result Value Ref Range   Yeast Wet Prep HPF POC NONE SEEN NONE SEEN   Trich, Wet Prep NONE SEEN NONE  SEEN   Clue Cells Wet Prep HPF POC NONE SEEN NONE SEEN   WBC, Wet Prep HPF POC FEW (A) NONE SEEN   Sperm NONE SEEN     --/--/O POS, O POS (11/01 0414)  IMAGING No results found.  MAU Management/MDM: Ordered usual first trimester r/o ectopic labs.   Pelvic exam and cultures done Will check baseline Ultrasound to rule out ectopic.  This bleeding/pain can represent a normal pregnancy with bleeding, spontaneous abortion or even an ectopic which can be life-threatening.  The process as listed above helps to determine which of these is present.    ASSESSMENT Pregnancy at [id number]  PLAN Report given to oncoming shift  Wynelle BourgeoisMarie Williams CNM, MSN Certified Nurse-Midwife 06/19/2017  6:16 PM   Patient has SIUP at 6 weeks and 3 days with cardiac activity.  Patient's pain is now 0/10.   PLAN 1. Pelvic pain affecting pregnancy   2. Pelvic pain affecting pregnancy    2. Patient stable for discharge with recommendations to return to MAU if she has increased pain, develops bleeding or other concerning ob-gyn complaint.  3. Patient unsure where she will start prenatal care; list of providers given.   Luna KitchensKathryn Kooistra CNM

## 2017-06-19 NOTE — MAU Note (Signed)
Urine in lab 

## 2017-06-19 NOTE — Discharge Instructions (Signed)
First Trimester of Pregnancy The first trimester of pregnancy is from week 1 until the end of week 13 (months 1 through 3). During this time, your baby will begin to develop inside you. At 6-8 weeks, the eyes and face are formed, and the heartbeat can be seen on ultrasound. At the end of 12 weeks, all the baby's organs are formed. Prenatal care is all the medical care you receive before the birth of your baby. Make sure you get good prenatal care and follow all of your doctor's instructions. Follow these instructions at home: Medicines  Take over-the-counter and prescription medicines only as told by your doctor. Some medicines are safe and some medicines are not safe during pregnancy.  Take a prenatal vitamin that contains at least 600 micrograms (mcg) of folic acid.  If you have trouble pooping (constipation), take medicine that will make your stool soft (stool softener) if your doctor approves. Eating and drinking  Eat regular, healthy meals.  Your doctor will tell you the amount of weight gain that is right for you.  Avoid raw meat and uncooked cheese.  If you feel sick to your stomach (nauseous) or throw up (vomit): ? Eat 4 or 5 small meals a day instead of 3 large meals. ? Try eating a few soda crackers. ? Drink liquids between meals instead of during meals.  To prevent constipation: ? Eat foods that are high in fiber, like fresh fruits and vegetables, whole grains, and beans. ? Drink enough fluids to keep your pee (urine) clear or pale yellow. Activity  Exercise only as told by your doctor. Stop exercising if you have cramps or pain in your lower belly (abdomen) or low back.  Do not exercise if it is too hot, too humid, or if you are in a place of great height (high altitude).  Try to avoid standing for long periods of time. Move your legs often if you must stand in one place for a long time.  Avoid heavy lifting.  Wear low-heeled shoes. Sit and stand up straight.  You  can have sex unless your doctor tells you not to. Relieving pain and discomfort  Wear a good support bra if your breasts are sore.  Take warm water baths (sitz baths) to soothe pain or discomfort caused by hemorrhoids. Use hemorrhoid cream if your doctor says it is okay.  Rest with your legs raised if you have leg cramps or low back pain.  If you have puffy, bulging veins (varicose veins) in your legs: ? Wear support hose or compression stockings as told by your doctor. ? Raise (elevate) your feet for 15 minutes, 3-4 times a day. ? Limit salt in your food. Prenatal care  Schedule your prenatal visits by the twelfth week of pregnancy.  Write down your questions. Take them to your prenatal visits.  Keep all your prenatal visits as told by your doctor. This is important. Safety  Wear your seat belt at all times when driving.  Make a list of emergency phone numbers. The list should include numbers for family, friends, the hospital, and police and fire departments. General instructions  Ask your doctor for a referral to a local prenatal class. Begin classes no later than at the start of month 6 of your pregnancy.  Ask for help if you need counseling or if you need help with nutrition. Your doctor can give you advice or tell you where to go for help.  Do not use hot tubs, steam rooms, or   saunas.  Do not douche or use tampons or scented sanitary pads.  Do not cross your legs for long periods of time.  Avoid all herbs and alcohol. Avoid drugs that are not approved by your doctor.  Do not use any tobacco products, including cigarettes, chewing tobacco, and electronic cigarettes. If you need help quitting, ask your doctor. You may get counseling or other support to help you quit.  Avoid cat litter boxes and soil used by cats. These carry germs that can cause birth defects in the baby and can cause a loss of your baby (miscarriage) or stillbirth.  Visit your dentist. At home, brush  your teeth with a soft toothbrush. Be gentle when you floss. Contact a doctor if:  You are dizzy.  You have mild cramps or pressure in your lower belly.  You have a nagging pain in your belly area.  You continue to feel sick to your stomach, you throw up, or you have watery poop (diarrhea).  You have a bad smelling fluid coming from your vagina.  You have pain when you pee (urinate).  You have increased puffiness (swelling) in your face, hands, legs, or ankles. Get help right away if:  You have a fever.  You are leaking fluid from your vagina.  You have spotting or bleeding from your vagina.  You have very bad belly cramping or pain.  You gain or lose weight rapidly.  You throw up blood. It may look like coffee grounds.  You are around people who have German measles, fifth disease, or chickenpox.  You have a very bad headache.  You have shortness of breath.  You have any kind of trauma, such as from a fall or a car accident. Summary  The first trimester of pregnancy is from week 1 until the end of week 13 (months 1 through 3).  To take care of yourself and your unborn baby, you will need to eat healthy meals, take medicines only if your doctor tells you to do so, and do activities that are safe for you and your baby.  Keep all follow-up visits as told by your doctor. This is important as your doctor will have to ensure that your baby is healthy and growing well. This information is not intended to replace advice given to you by your health care provider. Make sure you discuss any questions you have with your health care provider. Document Released: 08/17/2007 Document Revised: 03/08/2016 Document Reviewed: 03/08/2016 Elsevier Interactive Patient Education  2017 [employer]  

## 2017-06-19 NOTE — MAU Note (Signed)
Has been cramping ever since she has been pregnant.  Did some lifting today, now is having pain in lower abd. No bleeding.  Had some spotting a couple days ago. Has been OB, confirmed  preg and did an HCG level (? Last Tues-did not go back for f/u).

## 2017-06-20 LAB — GC/CHLAMYDIA PROBE AMP (~~LOC~~) NOT AT ARMC
Chlamydia: NEGATIVE
Neisseria Gonorrhea: NEGATIVE

## 2017-06-20 LAB — HIV ANTIBODY (ROUTINE TESTING W REFLEX): HIV Screen 4th Generation wRfx: NONREACTIVE

## 2017-06-27 ENCOUNTER — Inpatient Hospital Stay (HOSPITAL_COMMUNITY)
Admission: AD | Admit: 2017-06-27 | Discharge: 2017-06-27 | Disposition: A | Discharge status: Home / Self Care | Payer: 59 | Source: Ambulatory Visit | Attending: Obstetrics and Gynecology | Admitting: Obstetrics and Gynecology

## 2017-06-27 ENCOUNTER — Encounter (HOSPITAL_COMMUNITY): Payer: Self-pay | Admitting: *Deleted

## 2017-06-27 DIAGNOSIS — O99611 Diseases of the digestive system complicating pregnancy, first trimester: Secondary | ICD-10-CM | POA: Insufficient documentation

## 2017-06-27 DIAGNOSIS — O219 Vomiting of pregnancy, unspecified: Secondary | ICD-10-CM | POA: Diagnosis not present

## 2017-06-27 DIAGNOSIS — J45909 Unspecified asthma, uncomplicated: Secondary | ICD-10-CM | POA: Diagnosis not present

## 2017-06-27 DIAGNOSIS — F419 Anxiety disorder, unspecified: Secondary | ICD-10-CM | POA: Insufficient documentation

## 2017-06-27 DIAGNOSIS — O99011 Anemia complicating pregnancy, first trimester: Secondary | ICD-10-CM | POA: Diagnosis not present

## 2017-06-27 DIAGNOSIS — Z79899 Other long term (current) drug therapy: Secondary | ICD-10-CM | POA: Diagnosis not present

## 2017-06-27 DIAGNOSIS — O99511 Diseases of the respiratory system complicating pregnancy, first trimester: Secondary | ICD-10-CM | POA: Diagnosis not present

## 2017-06-27 DIAGNOSIS — O210 Mild hyperemesis gravidarum: Secondary | ICD-10-CM | POA: Diagnosis present

## 2017-06-27 DIAGNOSIS — E860 Dehydration: Secondary | ICD-10-CM

## 2017-06-27 DIAGNOSIS — O99341 Other mental disorders complicating pregnancy, first trimester: Secondary | ICD-10-CM | POA: Diagnosis not present

## 2017-06-27 DIAGNOSIS — Z3A01 Less than 8 weeks gestation of pregnancy: Secondary | ICD-10-CM | POA: Diagnosis not present

## 2017-06-27 DIAGNOSIS — K219 Gastro-esophageal reflux disease without esophagitis: Secondary | ICD-10-CM

## 2017-06-27 LAB — URINALYSIS, ROUTINE W REFLEX MICROSCOPIC
BACTERIA UA: NONE SEEN
Bilirubin Urine: NEGATIVE
Glucose, UA: NEGATIVE mg/dL
Hgb urine dipstick: NEGATIVE
Ketones, ur: 20 mg/dL — AB
Leukocytes, UA: NEGATIVE
Nitrite: NEGATIVE
PROTEIN: 30 mg/dL — AB
SPECIFIC GRAVITY, URINE: 1.027 (ref 1.005–1.030)
pH: 5 (ref 5.0–8.0)

## 2017-06-27 MED ORDER — ONDANSETRON 4 MG PO TBDP
4.0000 mg | ORAL_TABLET | Freq: Once | ORAL | Status: AC
  Administered 2017-06-27: 4 mg via ORAL
  Filled 2017-06-27: qty 1

## 2017-06-27 MED ORDER — ONDANSETRON 4 MG PO TBDP: 4.0000 mg | ORAL_TABLET | Freq: Four times a day (QID) | ORAL | 0 refills | Status: AC | PRN

## 2017-06-27 MED ORDER — LACTATED RINGERS IV BOLUS
1000.0000 mL | Freq: Once | INTRAVENOUS | Status: AC
  Administered 2017-06-27: 1000 mL via INTRAVENOUS

## 2017-06-27 MED ORDER — FAMOTIDINE IN NACL 20-0.9 MG/50ML-% IV SOLN
20.0000 mg | Freq: Once | INTRAVENOUS | Status: AC
  Administered 2017-06-27: 20 mg via INTRAVENOUS
  Filled 2017-06-27: qty 50

## 2017-06-27 NOTE — Discharge Instructions (Signed)
Dehydration, Adult °Dehydration is when there is not enough fluid or water in your body. This happens when you lose more fluids than you take in. Dehydration can range from mild to very bad. It should be treated right away to keep it from getting very bad. °Symptoms of mild dehydration may include: °· Thirst. °· Dry lips. °· Slightly dry mouth. °· Dry, warm skin. °· Dizziness. °Symptoms of moderate dehydration may include: °· Very dry mouth. °· Muscle cramps. °· Dark pee (urine). Pee may be the color of tea. °· Your body making less pee. °· Your eyes making fewer tears. °· Heartbeat that is uneven or faster than normal (palpitations). °· Headache. °· Light-headedness, especially when you stand up from sitting. °· Fainting (syncope). °Symptoms of very bad dehydration may include: °· Changes in skin, such as: °? Cold and clammy skin. °? Blotchy (mottled) or pale skin. °? Skin that does not quickly return to normal after being lightly pinched and let go (poor skin turgor). °· Changes in body fluids, such as: °? Feeling very thirsty. °? Your eyes making fewer tears. °? Not sweating when body temperature is high, such as in hot weather. °? Your body making very little pee. °· Changes in vital signs, such as: °? Weak pulse. °? Pulse that is more than 100 beats a minute when you are sitting still. °? Fast breathing. °? Low blood pressure. °· Other changes, such as: °? Sunken eyes. °? Cold hands and feet. °? Confusion. °? Lack of energy (lethargy). °? Trouble waking up from sleep. °? Short-term weight loss. °? Unconsciousness. °Follow these instructions at home: °· If told by your doctor, drink an ORS: °? Make an ORS by using instructions on the package. °? Start by drinking small amounts, about ½ cup (120 mL) every 5-10 minutes. °? Slowly drink more until you have had the amount that your doctor said to have. °· Drink enough clear fluid to keep your pee clear or pale yellow. If you were told to drink an ORS, finish the ORS  first, then start slowly drinking clear fluids. Drink fluids such as: °? Water. Do not drink only water by itself. Doing that can make the salt (sodium) level in your body get too low (hyponatremia). °? Ice chips. °? Fruit juice that you have added water to (diluted). °? Low-calorie sports drinks. °· Avoid: °? Alcohol. °? Drinks that have a lot of sugar. These include high-calorie sports drinks, fruit juice that does not have water added, and soda. °? Caffeine. °? Foods that are greasy or have a lot of fat or sugar. °· Take over-the-counter and prescription medicines only as told by your doctor. °· Do not take salt tablets. Doing that can make the salt level in your body get too high (hypernatremia). °· Eat foods that have minerals (electrolytes). Examples include bananas, oranges, potatoes, tomatoes, and spinach. °· Keep all follow-up visits as told by your doctor. This is important. °Contact a doctor if: °· You have belly (abdominal) pain that: °? Gets worse. °? Stays in one area (localizes). °· You have a rash. °· You have a stiff neck. °· You get angry or annoyed more easily than normal (irritability). °· You are more sleepy than normal. °· You have a harder time waking up than normal. °· You feel: °? Weak. °? Dizzy. °? Very thirsty. °· You have peed (urinated) only a small amount of very dark pee during 6-8 hours. °Get help right away if: °· You have symptoms of   very bad dehydration. °· You cannot drink fluids without throwing up (vomiting). °· Your symptoms get worse with treatment. °· You have a fever. °· You have a very bad headache. °· You are throwing up or having watery poop (diarrhea) and it: °? Gets worse. °? Does not go away. °· You have blood or something green (bile) in your throw-up. °· You have blood in your poop (stool). This may cause poop to look black and tarry. °· You have not peed in 6-8 hours. °· You pass out (faint). °· Your heart rate when you are sitting still is more than 100 beats a  minute. °· You have trouble breathing. °This information is not intended to replace advice given to you by your health care provider. Make sure you discuss any questions you have with your health care provider. °Document Released: 12/25/2008 Document Revised: 09/18/2015 Document Reviewed: 04/24/2015 °Elsevier Interactive Patient Education © 2018 [employer] ° °Morning Sickness °Morning sickness is when you feel sick to your stomach (nauseous) during pregnancy. This nauseous feeling may or may not come with vomiting. It often occurs in the morning but can be a problem any time of day. Morning sickness is most common during the first trimester, but it may continue throughout pregnancy. While morning sickness is unpleasant, it is usually harmless unless you develop severe and continual vomiting (hyperemesis gravidarum). This condition requires more intense treatment. °What are the causes? °The cause of morning sickness is not completely known but seems to be related to normal hormonal changes that occur in pregnancy. °What increases the risk? °You are at greater risk if you: °· Experienced nausea or vomiting before your pregnancy. °· Had morning sickness during a previous pregnancy. °· Are pregnant with more than one baby, such as twins. ° °How is this treated? °Do not use any medicines (prescription, over-the-counter, or herbal) for morning sickness without first talking to your health care provider. Your health care provider may prescribe or recommend: °· Vitamin B6 supplements. °· Anti-nausea medicines. °· The herbal medicine ginger. ° °Follow these instructions at home: °· Only take over-the-counter or prescription medicines as directed by your health care provider. °· Taking multivitamins before getting pregnant can prevent or decrease the severity of morning sickness in most women. °· Eat a piece of dry toast or unsalted crackers before getting out of bed in the morning. °· Eat five or six small meals a  day. °· Eat dry and bland foods (rice, baked potato). Foods high in carbohydrates are often helpful. °· Do not drink liquids with your meals. Drink liquids between meals. °· Avoid greasy, fatty, and spicy foods. °· Get someone to cook for you if the smell of any food causes nausea and vomiting. °· If you feel nauseous after taking prenatal vitamins, take the vitamins at night or with a snack. °· Snack on protein foods (nuts, yogurt, cheese) between meals if you are hungry. °· Eat unsweetened gelatins for desserts. °· Wearing an acupressure wristband (worn for sea sickness) may be helpful. °· Acupuncture may be helpful. °· Do not smoke. °· Get a humidifier to keep the air in your house free of odors. °· Get plenty of fresh air. °Contact a health care provider if: °· Your home remedies are not working, and you need medicine. °· You feel dizzy or lightheaded. °· You are losing weight. °Get help right away if: °· You have persistent and uncontrolled nausea and vomiting. °· You pass out (faint). °This information is not intended to replace   advice given to you by your health care provider. Make sure you discuss any questions you have with your health care provider. °Document Released: 04/21/2006 Document Revised: 08/06/2015 Document Reviewed: 08/15/2012 °Elsevier Interactive Patient Education © 2017 [employer] ° °

## 2017-06-27 NOTE — MAU Provider Note (Signed)
Chief Complaint: Emesis   First Provider Initiated Contact with Patient 06/27/17 2017        SUBJECTIVE HPI: Brittany Leblanc is a 22 y.o. G2P0010 at [redacted]w[redacted]d by LMP who presents to maternity admissions reporting nausea and vomiting.  Has diclegis but has not filled prescription.  . She denies vaginal bleeding, vaginal itching/burning, urinary symptoms, h/a, dizziness, or fever/chills.    Her mother thinks she needs IV fluids. States she vomits "constantly"  Gets prenatal care in [place] but plans to deliver here.  Discussed need to get provider where she wants to deliver.  [facility] physicians do not come here.   Emesis   This is a recurrent problem. The current episode started 1 to 4 weeks ago. The problem has been unchanged. There has been no fever. Pertinent negatives include no abdominal pain, chills, diarrhea, dizziness, fever or myalgias. She has tried nothing for the symptoms.    RN Note: PT   SAYS SINCE LAST WEEK - HAS HAD MORNING SICKNESS.    SAYS DR IN [place]-   GAVE HER A REFILL  OF  DICLEGIS  TODAY -  HAS NOT PICKED UP - BUT DOES NOT WORK .    N/V -  - SEEN LAST -  2 WEEKS AGO    Past Medical History:  Diagnosis Date  . Anemia   . Anxiety   . Asthma   . Chlamydia   . Complication of anesthesia    woke up during surgery  . Endometriosis   . GERD (gastroesophageal reflux disease)   . Gonorrhea   . PONV (postoperative nausea and vomiting)   . Trichimoniasis   . UTI (urinary tract infection)    Past Surgical History:  Procedure Laterality Date  . laproscopy    . Post t/a bleed    . TONSILLECTOMY    . TYMPANOSTOMY TUBE PLACEMENT    . WISDOM TOOTH EXTRACTION     Social History   Socioeconomic History  . Marital status: Single    Spouse name: Not on file  . Number of children: Not on file  . Years of education: Not on file  . Highest education level: Not on file  Occupational History  . Not on file  Social Needs  . Financial resource strain: Not on  file  . Food insecurity:    Worry: Not on file    Inability: Not on file  . Transportation needs:    Medical: Not on file    Non-medical: Not on file  Tobacco Use  . Smoking status: Never Smoker  . Smokeless tobacco: Never Used  Substance and Sexual Activity  . Alcohol use: Not Currently    Comment: occ  . Drug use: No  . Sexual activity: Yes  Lifestyle  . Physical activity:    Days per week: Not on file    Minutes per session: Not on file  . Stress: Not on file  Relationships  . Social connections:    Talks on phone: Not on file    Gets together: Not on file    Attends religious service: Not on file    Active member of club or organization: Not on file    Attends meetings of clubs or organizations: Not on file    Relationship status: Not on file  . Intimate partner violence:    Fear of current or ex partner: Not on file    Emotionally abused: Not on file    Physically abused: Not on file  Forced sexual activity: Not on file  Other Topics Concern  . Not on file  Social History Narrative   ** Merged History Encounter **       No current facility-administered medications on file prior to encounter.    Current Outpatient Medications on File Prior to Encounter  Medication Sig Dispense Refill  . albuterol (PROVENTIL HFA;VENTOLIN HFA) 108 (90 Base) MCG/ACT inhaler Inhale 1-2 puffs into the lungs every 6 (six) hours as needed for wheezing or shortness of breath.    . Doxylamine-Pyridoxine (DICLEGIS PO) Take by mouth.    . Prenatal Vit-Fe Fum-FA-Omega (ONE-A-DAY WOMENS PRENATAL PO) Take by mouth.    . Alum & Mag Hydroxide-Simeth (MAGIC MOUTHWASH) SOLN Take 5 mLs by mouth 4 (four) times daily as needed for mouth pain. 15 mL 0  . azithromycin (ZITHROMAX) 500 MG tablet Take 500 mg by mouth once.    . Fluticasone Propionate, Inhal, (FLOVENT IN) Inhale 2 puffs into the lungs daily.    . ondansetron (ZOFRAN) 4 MG tablet Take 1 tablet (4 mg total) by mouth every 8 (eight) hours as  needed for nausea or vomiting. 20 tablet 0  . Prenatal Vit-Fe Fumarate-FA (PRENATAL COMPLETE) 14-0.4 MG TABS Take 1 tablet by mouth daily. 60 each 0   No Known Allergies  I have reviewed patient's Past Medical Hx, Surgical Hx, Family Hx, Social Hx, medications and allergies.   ROS:  Review of Systems  Constitutional: Negative for chills and fever.  Gastrointestinal: Positive for vomiting. Negative for abdominal pain and diarrhea.  Musculoskeletal: Negative for myalgias.  Neurological: Negative for dizziness.   Review of Systems  Other systems negative   Physical Exam  Physical Exam Patient Vitals for the past 24 hrs:  BP Temp Temp src Pulse Resp Height Weight  06/27/17 1941 105/62 98.7 F (37.1 C) Oral 85 18 5\' 5"  (1.651 m) 187 lb 4 oz (84.9 kg)   Constitutional: Well-developed, well-nourished female in no acute distress.  Cardiovascular: normal rate Respiratory: normal effort GI: Abd soft, non-tender. Pos BS x 4 MS: Extremities nontender, no edema, normal ROM Neurologic: Alert and oriented x 4.  GU: Neg CVAT.  PELVIC EXAM: deferred  LAB RESULTS Results for orders placed or performed during the hospital encounter of 06/27/17 (from the past 24 hour(s))  Urinalysis, Routine w reflex microscopic     Status: Abnormal   Collection Time: 06/27/17  7:43 PM  Result Value Ref Range   Color, Urine YELLOW YELLOW   APPearance HAZY (A) CLEAR   Specific Gravity, Urine 1.027 1.005 - 1.030   pH 5.0 5.0 - 8.0   Glucose, UA NEGATIVE NEGATIVE mg/dL   Hgb urine dipstick NEGATIVE NEGATIVE   Bilirubin Urine NEGATIVE NEGATIVE   Ketones, ur 20 (A) NEGATIVE mg/dL   Protein, ur 30 (A) NEGATIVE mg/dL   Nitrite NEGATIVE NEGATIVE   Leukocytes, UA NEGATIVE NEGATIVE   RBC / HPF 0-5 0 - 5 RBC/hpf   WBC, UA 0-5 0 - 5 WBC/hpf   Bacteria, UA NONE SEEN NONE SEEN   Squamous Epithelial / LPF 0-5 (A) NONE SEEN   Mucus PRESENT     --/--/O POS, O POS (11/01 0414)  IMAGING   MAU  Management/MDM: IV fluids administered Pepcid given due to c/o history of reflux Zofran ODT given  She had good relief of reflux and nausea   ASSESSMENT Single IUP at [id number] Nausea and vomiting  PLAN Discharge home Rx Zofran for nausea. Encouraged to fill Rx for Diclegis and use  it as first line and zofran only if fails Call her doctor in AM for plan  Pt stable at time of discharge. Encouraged to return here or to other Urgent Care/ED if she develops worsening of symptoms, increase in pain, fever, or other concerning symptoms.    Wynelle Bourgeois CNM, MSN Certified Nurse-Midwife 06/27/2017  8:18 PM

## 2018-05-03 ENCOUNTER — Encounter (HOSPITAL_COMMUNITY): Payer: Self-pay
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
## 2019-10-05 IMAGING — US US OB TRANSVAGINAL
1 series · 15 of 28 positions shown · non-contrast
Comparison: None.

CLINICAL DATA: Initial evaluation acute pelvic pain, early
pregnancy.

EXAM:
OBSTETRIC <14 WK US AND TRANSVAGINAL OB US
TECHNIQUE: Both transabdominal and transvaginal ultrasound examinations were
performed for complete evaluation of the gestation as well as the
maternal uterus, adnexal regions, and pelvic cul-de-sac.
Transvaginal technique was performed to assess early pregnancy.

[Series 1: us ob transvaginal · 15 of 69 slices shown]
[im 1/69]
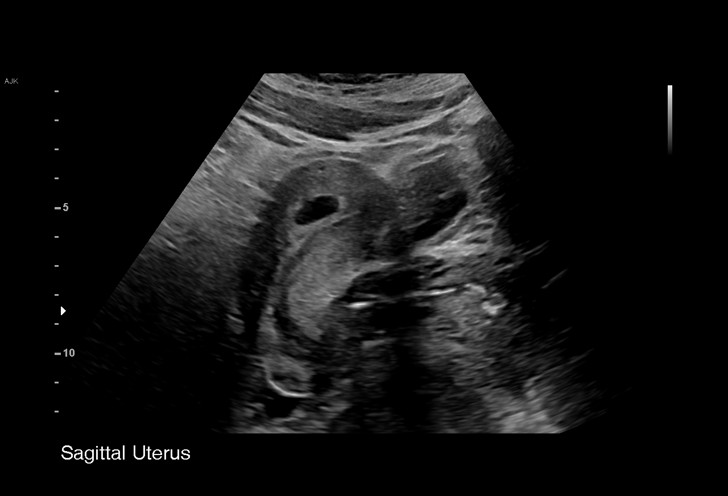
[im 6/69]
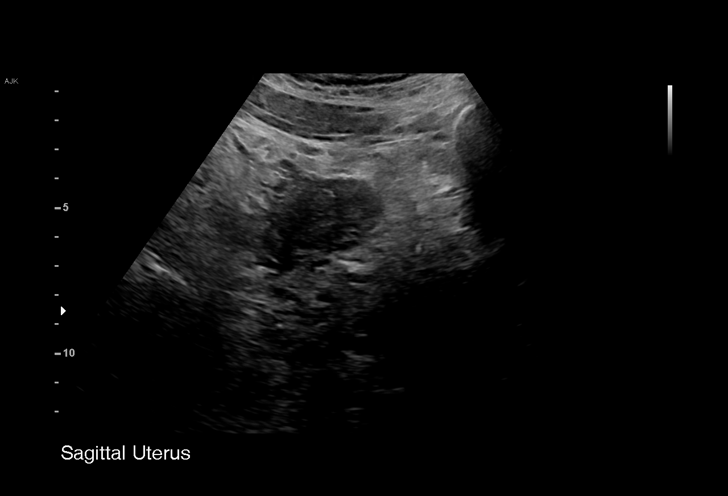
[im 11/69]
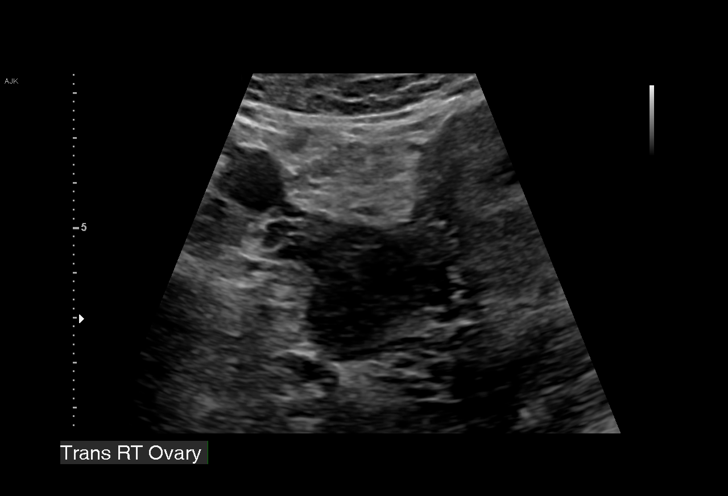
[im 16/69]
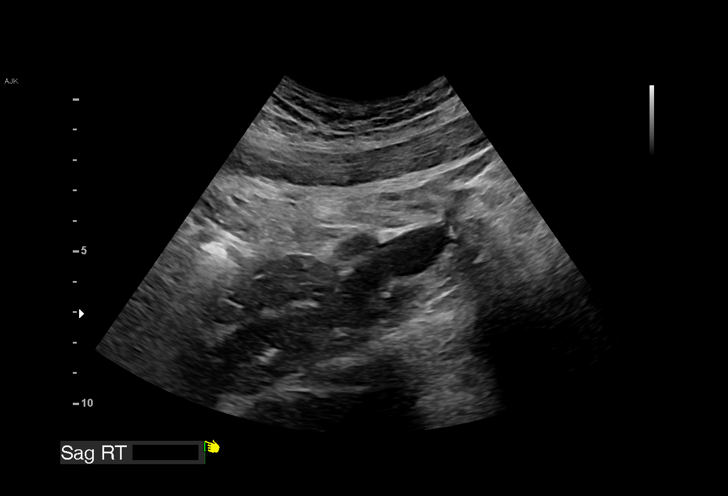
[im 21/69]
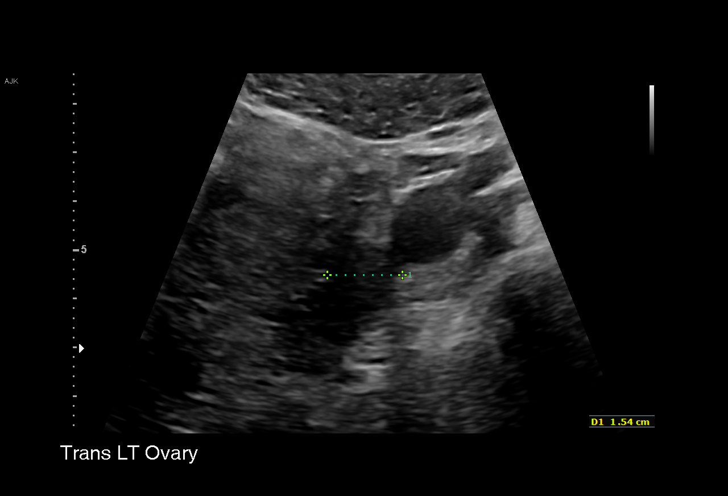
[im 26/69]
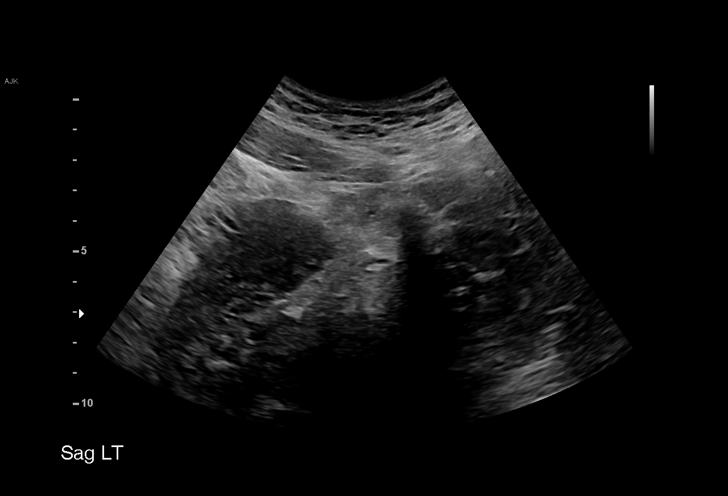
[im 31/69]
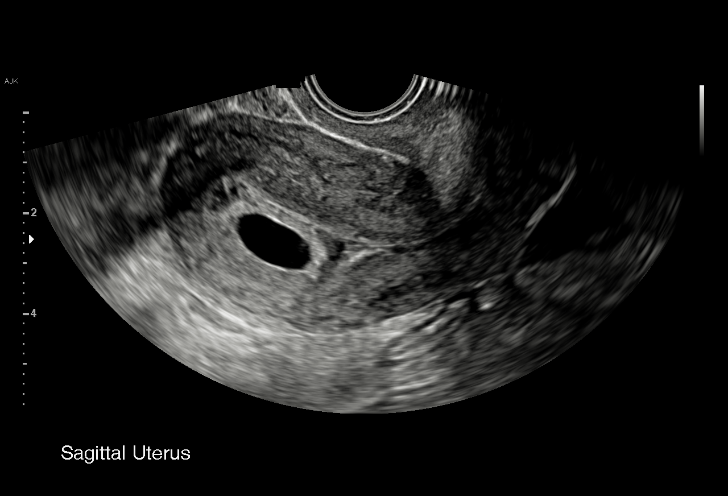
[im 36/69]
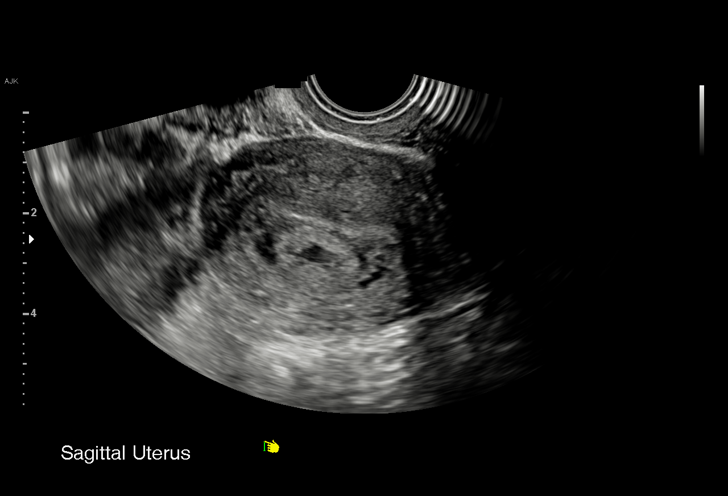
[im 38/69]
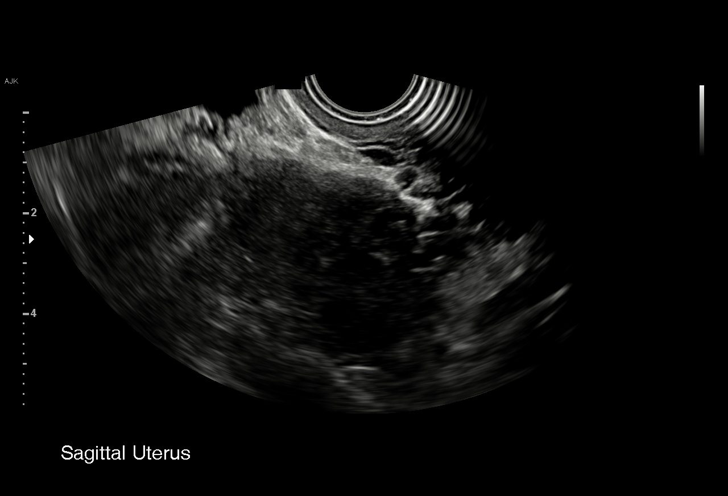
[im 43/69]
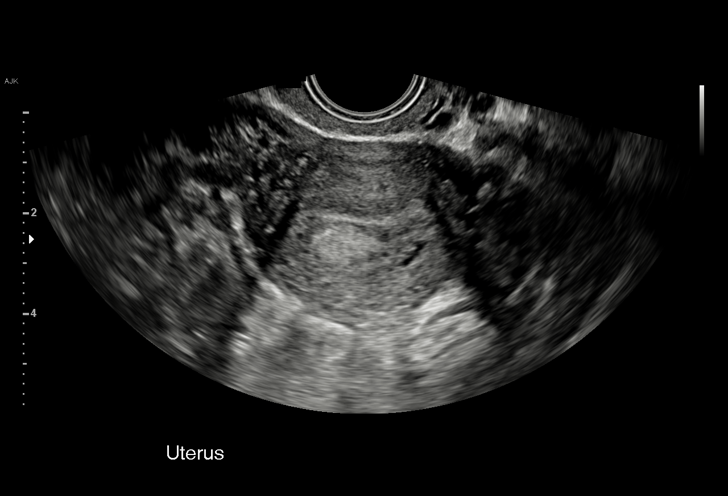
[im 48/69]
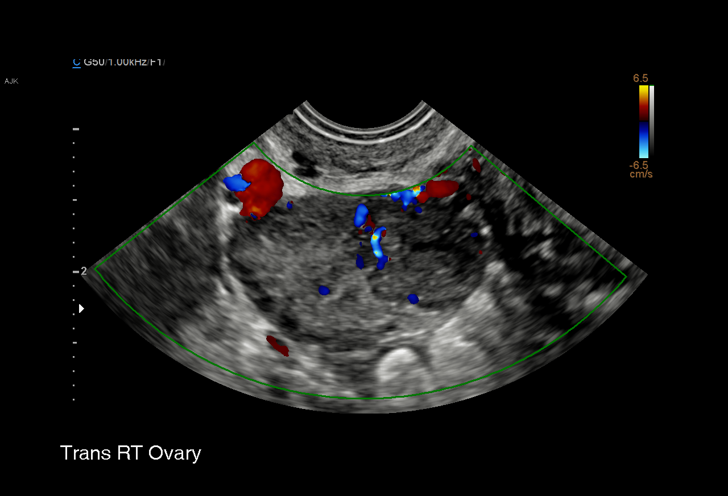
[im 53/69]
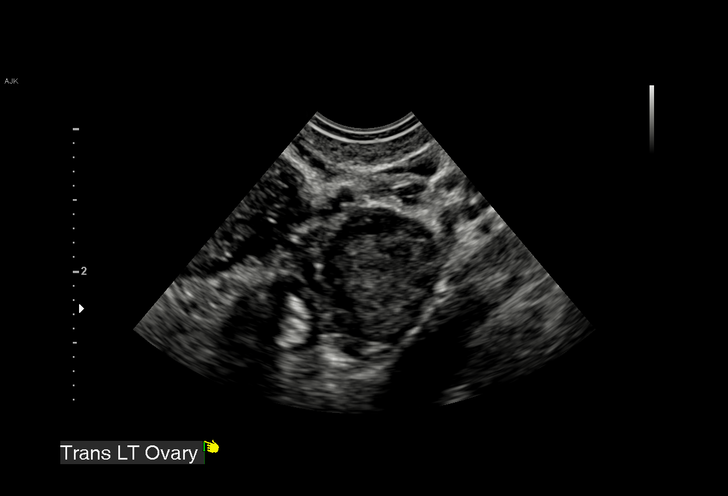
[im 58/69]
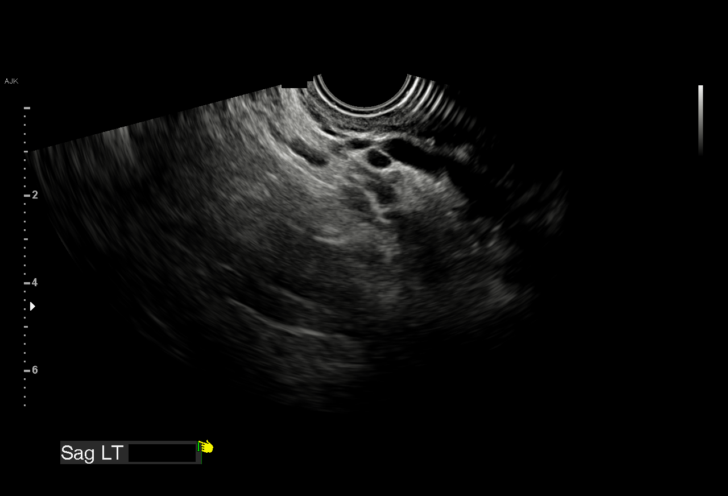
[im 63/69]
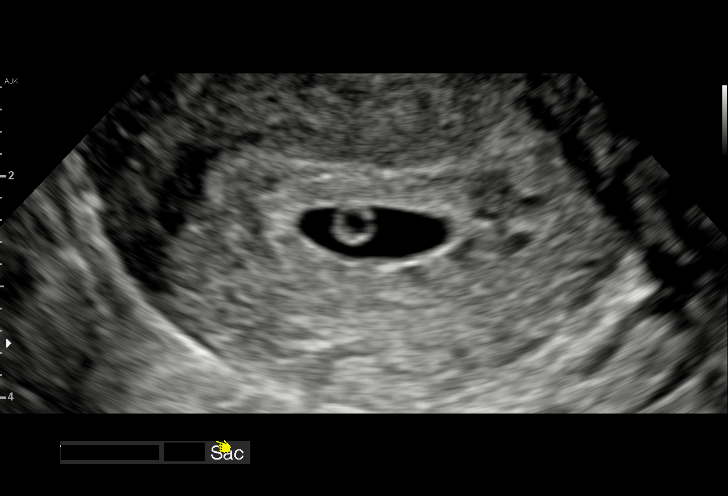
[im 69/69]
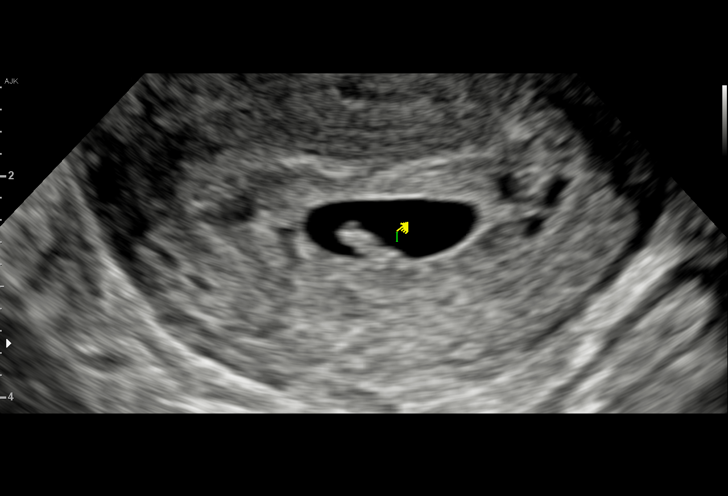

[15 of 28 positions shown; findings below may reference images not displayed]

FINDINGS: Intrauterine gestational sac: Single

Yolk sac:  Present

Embryo:  Present

Cardiac Activity: Present

Heart Rate: 121 bpm

CRL: 6.3 mm   6 w   3 d                  US EDC: 02/09/2018

Subchorionic hemorrhage:  None visualized.

Maternal uterus/adnexae: Ovaries are normal in appearance
bilaterally. Small corpus luteal cyst noted on the right. No free
fluid.
IMPRESSION: 1. Single viable intrauterine pregnancy as above without
complication, estimated gestational age 6 weeks and 3 days by
sonography.
2. No other acute maternal uterine or adnexal abnormality
identified.
